# Patient Record
Sex: Female | Born: 1965 | Race: White | Hispanic: No | Marital: Married
Health system: Western US, Academic
[De-identification: ages and names within clinical notes are randomized; demographics above are authoritative.]

## PROBLEM LIST (undated history)

## (undated) DIAGNOSIS — Q796 Ehlers-Danlos syndrome, unspecified: Secondary | ICD-10-CM

## (undated) HISTORY — PX: TMJ  BILATERAL: RADDX00252

## (undated) HISTORY — PX: EXTRACTION, CATARACT: SHX000657

---

## 2009-05-04 IMAGING — CR DG SACRUM/COCCYX 2+V
3 series · 3 of 3 positions shown · non-contrast
Comparison: none

CLINICAL DATA: Injury in [DATE].  Continued pain. 
 SACRUM/COCCYX ? 3 VIEW:

[t coccyx a.p.]
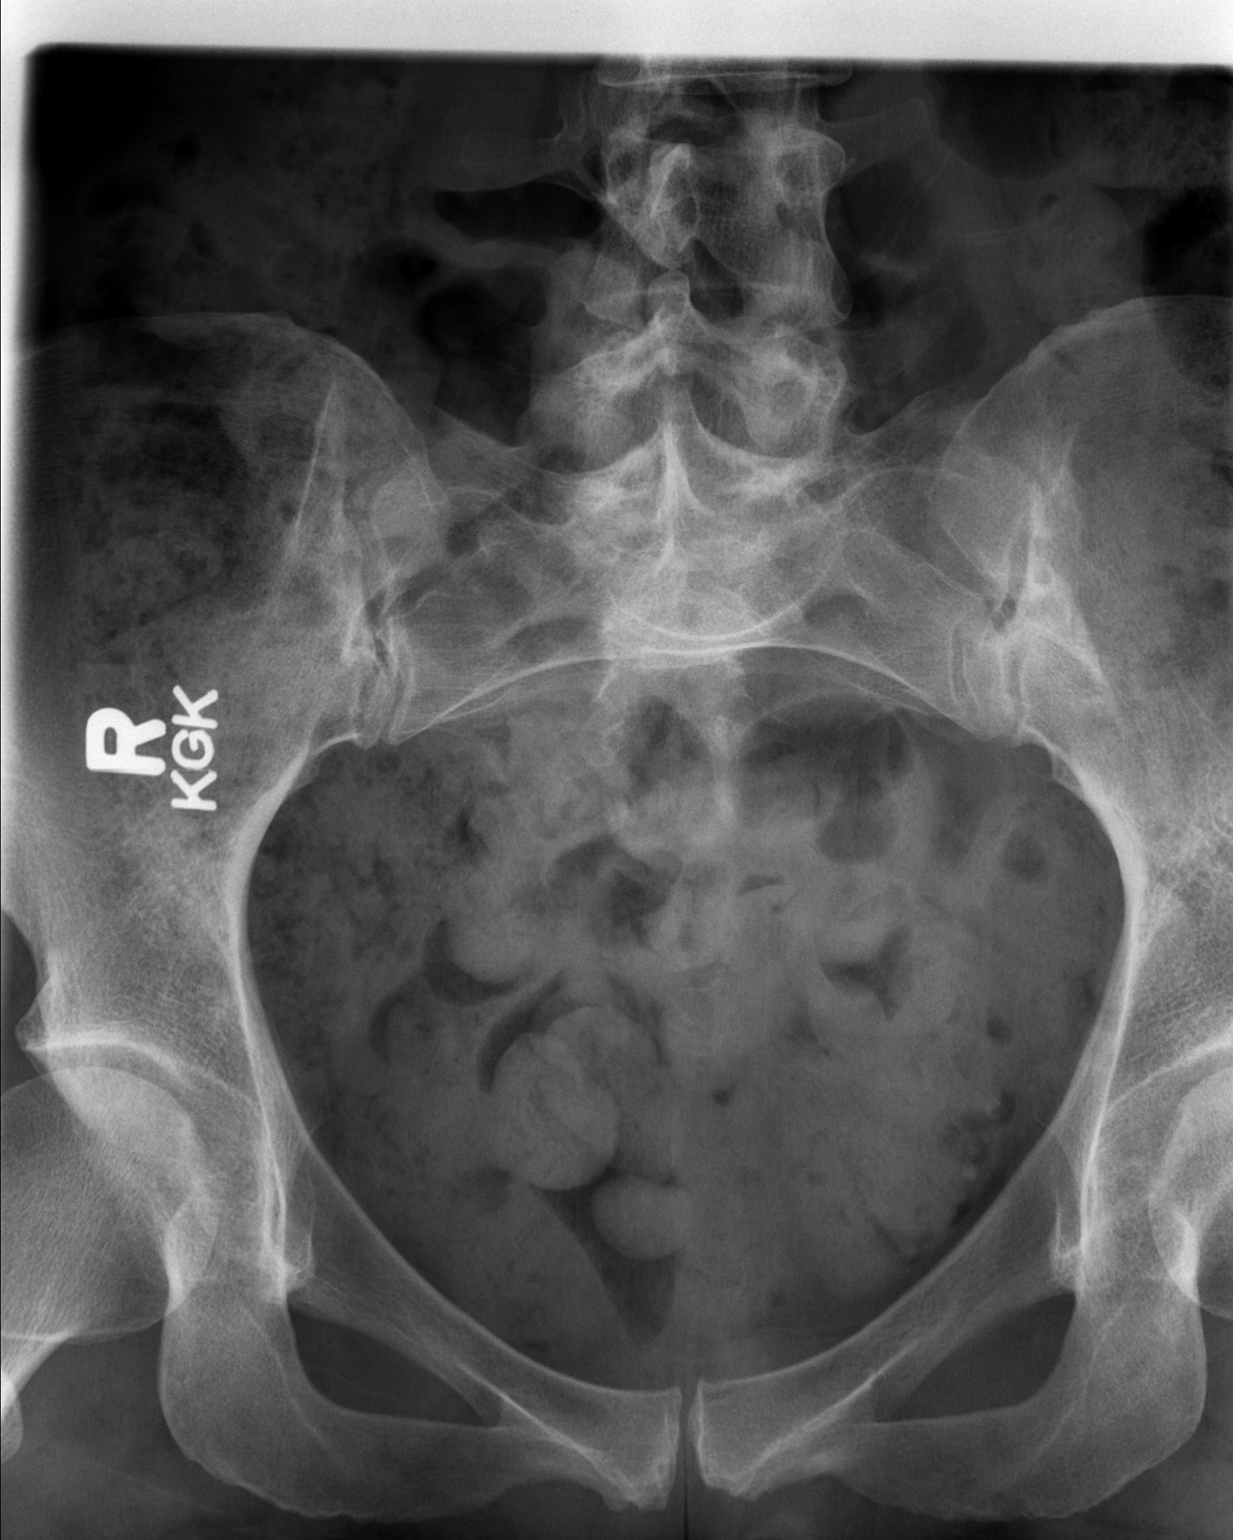

[t sacrum a.p.]
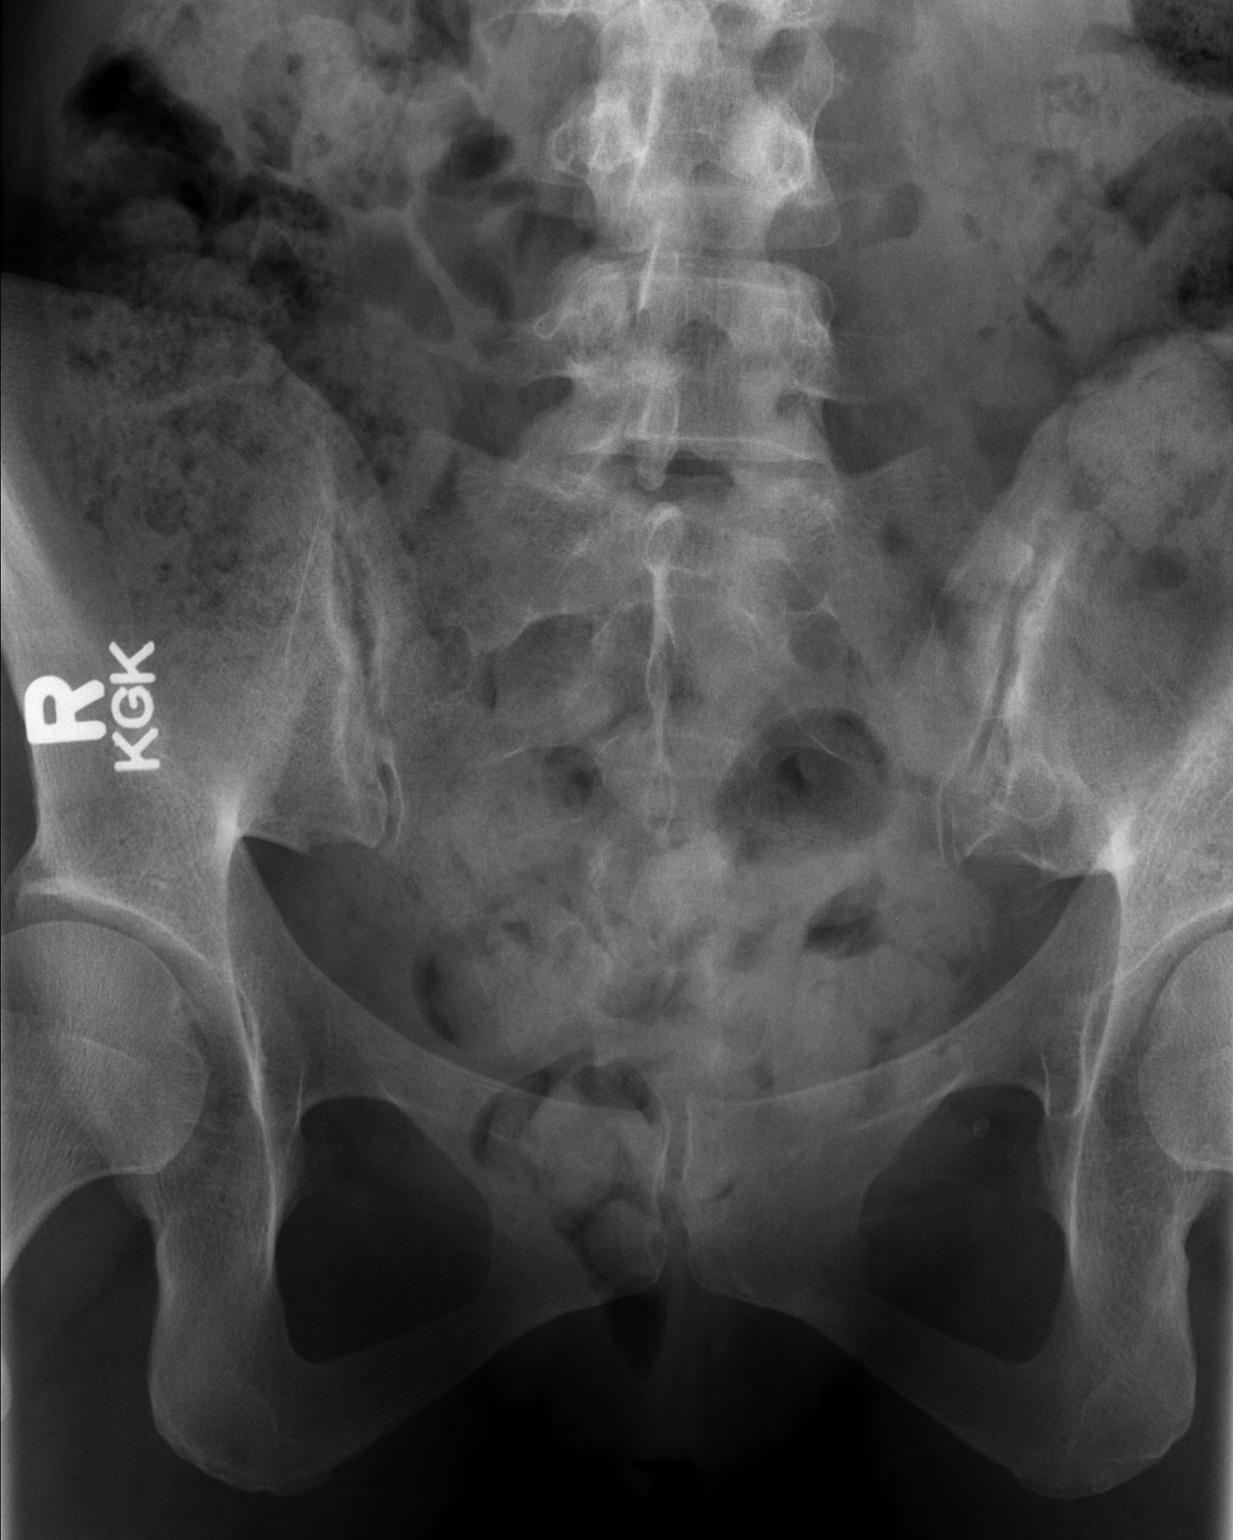

[t sacrum lat]
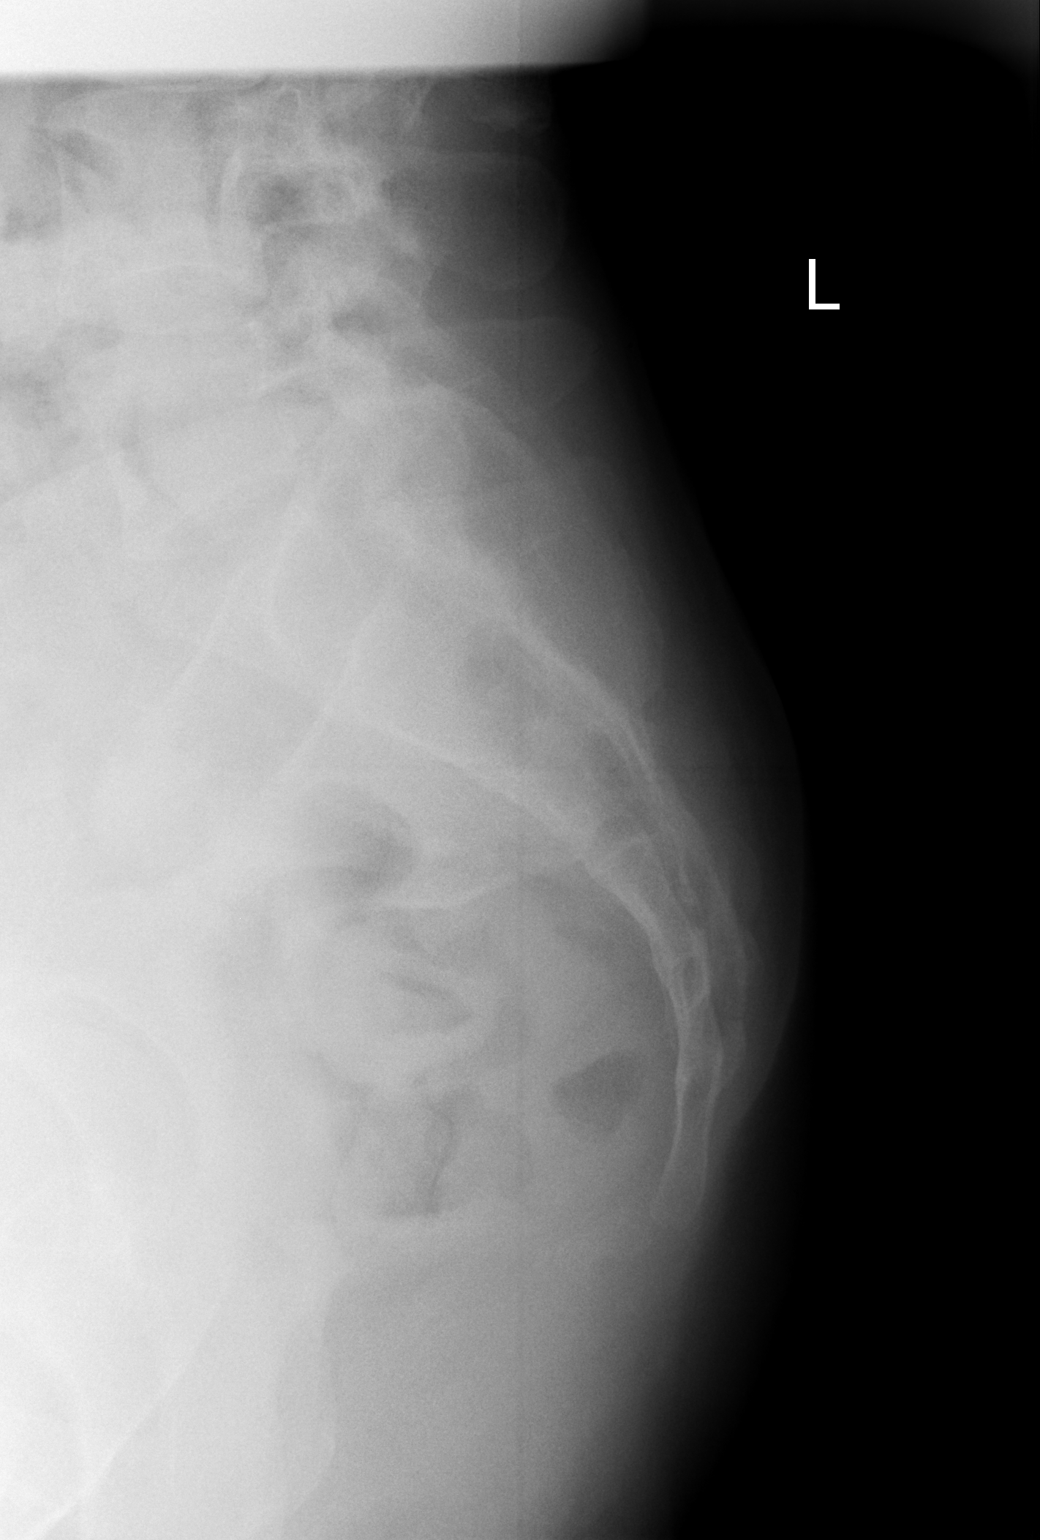

[3 of 3 positions shown; findings below may reference images not displayed]

FINDINGS: Sacroiliac joints appear normal.  The sacrum and coccyx are intact.
IMPRESSION: Negative exam.

## 2018-11-23 ENCOUNTER — Emergency Department (EMERGENCY_DEPARTMENT_HOSPITAL): Payer: Commercial Managed Care - HMO

## 2018-11-23 ENCOUNTER — Emergency Department
Admission: EM | Admit: 2018-11-23 | Discharge: 2018-11-23 | Disposition: A | Discharge status: Home / Self Care | Payer: Commercial Managed Care - HMO | Attending: Emergency Medicine | Admitting: Emergency Medicine

## 2018-11-23 DIAGNOSIS — R9431 Abnormal electrocardiogram [ECG] [EKG]: Secondary | ICD-10-CM

## 2018-11-23 DIAGNOSIS — R030 Elevated blood-pressure reading, without diagnosis of hypertension: Secondary | ICD-10-CM | POA: Insufficient documentation

## 2018-11-23 DIAGNOSIS — Q796 Ehlers-Danlos syndrome, unspecified: Secondary | ICD-10-CM | POA: Insufficient documentation

## 2018-11-23 DIAGNOSIS — R0602 Shortness of breath: Secondary | ICD-10-CM

## 2018-11-23 DIAGNOSIS — J111 Influenza due to unidentified influenza virus with other respiratory manifestations: Principal | ICD-10-CM | POA: Insufficient documentation

## 2018-11-23 HISTORY — DX: Ehlers-Danlos syndrome, unspecified: Q79.60

## 2018-11-23 LAB — HEPATIC FUNCTION PANEL
ALANINE TRANSFERASE (ALT): 23 U/L (ref 5–54)
ALBUMIN: 3.5 g/dL (ref 3.4–4.8)
ALKALINE PHOSPHATASE (ALP): 52 U/L (ref 35–115)
ASPARTATE TRANSAMINASE (AST): 20 U/L (ref 15–43)
BILIRUBIN DIRECT: 0.1 mg/dL (ref 0.0–0.2)
BILIRUBIN TOTAL: 0.6 mg/dL (ref 0.3–1.3)
PROTEIN: 6.7 g/dL (ref 6.3–8.3)

## 2018-11-23 LAB — BASIC METABOLIC PANEL
CALCIUM: 9 mg/dL (ref 8.6–10.5)
CARBON DIOXIDE TOTAL: 24 mmol/L (ref 24–32)
CHLORIDE: 104 mmol/L (ref 95–110)
CREATININE BLOOD: 0.67 mg/dL (ref 0.44–1.27)
GLUCOSE: 100 mg/dL — AB (ref 70–99)
POTASSIUM: 3.8 mmol/L (ref 3.3–5.0)
SODIUM: 137 mmol/L (ref 135–145)
UREA NITROGEN, BLOOD (BUN): 8 mg/dL (ref 8–22)

## 2018-11-23 LAB — CBC WITH DIFFERENTIAL
BASOPHILS % AUTO: 0.5 %
BASOPHILS ABS AUTO: 0 10*3/uL (ref 0.0–0.2)
EOSINOPHIL % AUTO: 0.6 %
EOSINOPHIL ABS AUTO: 0 10*3/uL (ref 0.0–0.5)
HEMATOCRIT: 41.2 % (ref 36.0–46.0)
HEMOGLOBIN: 14 g/dL (ref 12.0–16.0)
LYMPHOCYTE ABS AUTO: 1.3 10*3/uL (ref 1.0–4.8)
LYMPHOCYTES % AUTO: 16.3 %
MCH: 32.4 pg (ref 27.0–33.0)
MCHC: 33.9 % (ref 32.0–36.0)
MCV: 95.6 fL (ref 80.0–100.0)
MONOCYTES % AUTO: 6.6 %
MONOCYTES ABS AUTO: 0.5 10*3/uL (ref 0.1–0.8)
MPV: 8.4 fL (ref 6.8–10.0)
NEUTROPHIL ABS AUTO: 6 10*3/uL (ref 1.8–7.7)
NEUTROPHILS % AUTO: 76 %
PLATELET COUNT: 248 10*3/uL (ref 130–400)
RDW: 14 % (ref 0.0–14.7)
RED CELL COUNT: 4.31 10*6/uL (ref 4.00–5.20)
WHITE BLOOD CELL COUNT: 7.9 10*3/uL (ref 4.5–11.0)

## 2018-11-23 LAB — POC FLU TEST: POC FLU TEST: POSITIVE — AB

## 2018-11-23 LAB — LIPASE: LIPASE: 36 U/L (ref 13–51)

## 2018-11-23 MED ORDER — NACL 0.9% IV BOLUS - DURATION REQ
1000.0000 mL | Freq: Once | INTRAVENOUS | Status: AC
Start: 2018-11-23 — End: 2018-11-23
  Administered 2018-11-23: 1000 mL via INTRAVENOUS

## 2018-11-23 MED ORDER — ONDANSETRON HCL (PF) 4 MG/2 ML INJECTION SOLUTION
4.0000 mg | Freq: Once | INTRAMUSCULAR | Status: AC
Start: 2018-11-23 — End: 2018-11-23
  Administered 2018-11-23: 4 mg via INTRAVENOUS
  Filled 2018-11-23: qty 2, fill #0

## 2018-11-23 MED ORDER — KETOROLAC 15 MG/ML INJECTION SOLUTION
15.0000 mg | Freq: Once | INTRAMUSCULAR | Status: AC
Start: 2018-11-23 — End: 2018-11-23
  Administered 2018-11-23: 15 mg via INTRAVENOUS
  Filled 2018-11-23: qty 1, fill #0

## 2018-11-23 MED ORDER — ONDANSETRON HCL 4 MG TABLET
4.0000 mg | ORAL_TABLET | Freq: Three times a day (TID) | ORAL | 0 refills | Status: AC | PRN
Start: 2018-11-23 — End: 2018-12-23

## 2018-11-23 NOTE — Discharge Instructions (Signed)
Rest, fluids, and over-the-counter pain medicine as needed.   Return if symptoms worsen over the next days or have trouble breathing. Follow up with PCP within 2-3 days.

## 2018-11-23 NOTE — ED Triage Note (Signed)
Pt presents with "flu like symptoms" since last Tuesday, head and neck pains started on Saturday, last Tuesday pt was in the ED in [place] for swollen tongue and flu like symptoms after taking steroids and muscle relaxer, pt also with rash to abd, pt speaking full sentences, pt to D6

## 2018-11-23 NOTE — ED Provider Notes (Addendum)
EMERGENCY DEPARTMENT PHYSICIAN NOTE - Mills KollerKim Gunning       Date of Service:   11/23/2018  8:14 AM Patient's PCP: Patient, No Pcp Per   Note Started: 11/23/2018 09:31 DOB: 10/04/1966         Chief Complaint   Patient presents with    Vomiting       The history provided by the patient.  Interpreter used: No    Mills KollerKim Abalos is a [age]yr old female, who Ehlers-Danlos, Scoliosis s/p Spine fusion presenting to the ED with a chief complaint of flu-like symptoms that began 7 days ago.    Patient complains of nasal congestion, and dry cough that began 7 days ago. For the last 4 days pt. has had ongoing nausea and non-bloody emesis, having about 2-3 episodes per day.She reports episodic fevers, ranging from 100-101 F, but denies chills. Also reports right-sided headache with increased sensitivity to light. Headache described as stabbing headache extending from the periorbital region to the right cervical region, no lacrimation. Endorses sinus tenderness around the right maxillary and frontal sinus. Has had decreased PO and liquid intake as she has been unable to keep anything down for the last 2-3 days.  Denies purulent rhinorrhea, productive cough, ear pain/swelling. Denies neck rigidity. Denies dyspnea, chest pain, and respiratory distress.     Of note, patient wen to the ED on 12/17 for laryngeal swelling after completing a 12-day course of  Steroids  for chronic back. Pt believes she might have developed an allergic reaction to cyclobenzaprine. 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       A full history, including past medical, social, and family history (as detailed in this note), was reviewed and updated as necessary.      HISTORY:    No past medical history on file. No Known Allergies   Past Surgical History:  No date: Extraction, cataract  No date: Tmj  bilateral No current outpatient medications on file.   Social History      Tobacco Use    Smoking status: Not on file   Substance Use Topics    Alcohol use: Not on file    Drug use: Not on file     Social History     Patient does not qualify to have social determinant information on file (likely too young).   Social History Narrative    Not on file    No family history on file.        Review of Systems   Constitutional: Positive for activity change, fatigue and fever. Negative for chills, diaphoresis and unexpected weight change.   HENT: Positive for congestion, sinus pressure and sinus pain. Negative for dental problem, drooling, ear discharge, ear pain, facial swelling, hearing loss, mouth sores, nosebleeds, rhinorrhea, sore throat, tinnitus, trouble swallowing and voice change.    Eyes: Negative for photophobia, pain, discharge, redness, itching and visual disturbance.   Respiratory: Negative for apnea, cough, choking, chest tightness, shortness of breath, wheezing and stridor.    Cardiovascular: Negative for chest pain, palpitations and leg swelling.   Gastrointestinal: Positive for abdominal pain, nausea and vomiting. Negative for blood in stool, constipation and diarrhea.   Genitourinary: Negative for dysuria and hematuria.   Musculoskeletal: Positive for arthralgias, myalgias and neck pain. Negative for neck stiffness.          TRIAGE VITAL SIGNS:  Temp: 36.7 C (98 F) (11/23/18  [id number])  Temp src: (not recorded)  Pulse: 84 (11/23/18 0819)  BP: 132/87 (11/23/18 0819)  Resp: 22 (11/23/18 0819)  SpO2: 99 % (11/23/18 0819)  Weight: 53.7 kg (118 lb 6.2 oz) (11/23/18 0819)    Physical Exam  Constitutional:       General: She is not in acute distress.     Appearance: Normal appearance. She is normal weight. She is ill-appearing. She is not toxic-appearing.   HENT:      Head: Normocephalic and atraumatic.      Right Ear: Tympanic membrane, ear canal and external ear normal.      Left Ear: Tympanic membrane, ear canal and external ear normal.      Nose: No mucosal edema or rhinorrhea.       Right Sinus: Maxillary sinus tenderness and frontal sinus tenderness present.      Left Sinus: No maxillary sinus tenderness or frontal sinus tenderness.      Mouth/Throat:      Mouth: Mucous membranes are dry.      Pharynx: No oropharyngeal exudate or posterior oropharyngeal erythema.   Eyes:      General: No scleral icterus.        Right eye: No discharge.         Left eye: No discharge.      Extraocular Movements: Extraocular movements intact.      Conjunctiva/sclera: Conjunctivae normal.      Pupils: Pupils are equal, round, and reactive to light.   Neck:      Musculoskeletal: Full passive range of motion without pain and normal range of motion. No neck rigidity.      Meningeal: Kernig's sign absent.   Cardiovascular:      Rate and Rhythm: Normal rate and regular rhythm.      Pulses: Normal pulses.   Pulmonary:      Effort: Pulmonary effort is normal. No respiratory distress.      Breath sounds: No stridor. Rhonchi and rales present. No wheezing.   Abdominal:      General: Abdomen is flat. There is no distension.      Tenderness: There is no guarding or rebound.      Comments: Mild tenderness to deep palpation over epigastric region. BS present.    Musculoskeletal:         General: No swelling.      Right lower leg: No edema.      Left lower leg: No edema.   Lymphadenopathy:      Cervical: Cervical adenopathy present.   Skin:     General: Skin is warm.      Capillary Refill: Capillary refill takes less than 2 seconds.   Neurological:      General: No focal deficit present.      Mental Status: She is alert and oriented to person, place, and time.   Psychiatric:         Mood and Affect: Mood normal.              INITIAL ASSESSMENT & PLAN, MEDICAL DECISION MAKING, ED COURSE  Mills KollerKim Nealy is a [age]yr female who presents with a chief complaint of flu-like syptoms    Differential includes, but is not limited to: Influenza, viral URI, sinusitis, CAP      The results of the ED evaluation were notable for the  following:     Pertinent lab results:  Labs Reviewed   BASIC METABOLIC PANEL - Abnormal; Notable for the following components:  Result Value    Glucose 100 (*)     All other components within normal limits   POC FLU TEST - Abnormal; Notable for the following components:    POC FLU TEST Positive (*)     All other components within normal limits   HEPATIC FUNCTION PANEL - Normal   LIPASE - Normal   CBC WITH DIFFERENTIAL       Pertinent imaging results (reviewed and interpreted independently by me):   X-Rays: Chest X-Ray: Mediastinum within normal limits, no pneumonia, no pleural effusion    Radiology reads:   Chest Pa/lat    Result Date: 11/23/2018  CHEST RADIOGRAPHY, PA AND LATERAL EXAM DATE: 11/23/2018 10:06 AM COMPARISON:  none INDICATION: Shortness of breath. FINDINGS: Heart and mediastinum are within normal limits.  The lungs are clear.  No effusions. Surgical hardware along the entire length of the posterior T-spine. IMPRESSION: No acute cardiopulmonary process. Final Report Electronically Signed By: Clint Lipps, M.D. on 11/23/2018 10:49 AM        ED Medication Administration through 11/23/2018 0931     Date/Time Order Dose Route Action    11/23/2018 0931 Ketorolac (TORADOL) Injection 15 mg 15 mg IV Given    11/23/2018 0931 Ondansetron (ZOFRAN) Injection 4 mg 4 mg IV Given          Chart Review: I reviewed the patient's prior medical records. Pertinent information that is relevant to this encounter         PATIENT SUMMARY  Rania Prothero is a 52yr old female with PMH Ehlers-Danlos and scoliosis s/p Spine fusion presenting to the ED with a chief complaint of flu-like symptoms that began 7 days ago.    Patient reports a 7 day history of nasal congestion, dry cough, myalgias, nausea, non-bloody, headache,  emesis, and episodic fever in the setting of recent completion of 12-day course of corticosteroids. Vitals notable for HTN 150's, but otherwise within normal range. HEENT exam notable for dry mucous membranes,  cervical LDA, bilateral coarse lung sounds. Labs notable for a positive Flu. This is most consistent with influenza. Also considered PNA, but cough is non-productive, with no signs of consolidation on CXR.     Patient vitals are stable. Responded avorably to 1L Bolus NS,  Zofran and Toradol injection. Headache and nausea improved.  Patient tolerated a PO trial.            LAST VITAL SIGNS:  Temp: 36.7 C (98 F) (11/23/18 0819)  Temp src: (not recorded)  Pulse: 84 (11/23/18 0819)  BP: 132/87 (11/23/18 0819)  Resp: 22 (11/23/18 0819)  SpO2: 99 % (11/23/18 0819)  Weight: 53.7 kg (118 lb 6.2 oz) (11/23/18 0819)      Clinical Impression: Influenza    Disposition: Discharge. Follow up with PCP as needed. ED discharge instructions were reviewed and provided.              PATIENT'S GENERAL CONDITION:  Fair: Vital signs are stable and within normal limits. Patient is conscious but may be uncomfortable. Indicators are favorable.      Conrad Burlington, MS4  [phone]    I, Onalee Hua, MD, was physically present with the medical student during the examination of the patient.  I personally examined this patient and developed the assessment and plan.  I verified the student's documentation and made changes as appropriate.    The documentation accurately reflects my findings of the patient's history and exam and my assessment and plan.    Electronically  signed by: Onalee Hua, MD, Resident         This patient was seen, evaluated, and care plan was developed with the resident.  I agree with the findings and plan as outlined in our combined note.    Estanislado Spire, MD      Electronically signed by: Estanislado Spire, MD, Attending Physician

## 2018-11-23 NOTE — ED Nursing Note (Signed)
Pt reviewd by MD plan for dc home IV dc'd letter and prescription provided. Pt ambulating steady

## 2018-11-23 NOTE — ED Nursing Note (Signed)
Pt arrived to D7. Alert and awake complaining of neck stiffness and headache, no nausea afebrile.

## 2018-12-03 LAB — ELECTROCARDIOGRAM WITH RHYTHM STRIP: QTC: 435
